# Patient Record
Sex: Male | Born: 1985 | Hispanic: Yes | Marital: Single
Health system: Southern US, Community
[De-identification: ages and names within clinical notes are randomized; demographics above are authoritative.]

---

## 2010-06-03 ENCOUNTER — Emergency Department (HOSPITAL_COMMUNITY): Admission: EM | Admit: 2010-06-03 | Discharge: 2010-06-03 | Payer: Self-pay | Admitting: Emergency Medicine

## 2018-04-14 ENCOUNTER — Encounter (HOSPITAL_COMMUNITY): Payer: Self-pay

## 2018-04-14 ENCOUNTER — Emergency Department (HOSPITAL_COMMUNITY): Payer: Self-pay

## 2018-04-14 ENCOUNTER — Inpatient Hospital Stay (HOSPITAL_COMMUNITY)
Admission: EM | Admit: 2018-04-14 | Discharge: 2018-04-18 | DRG: 982 | Disposition: A | Discharge status: Home / Self Care | Payer: Self-pay | Attending: General Surgery | Admitting: General Surgery

## 2018-04-14 ENCOUNTER — Inpatient Hospital Stay (HOSPITAL_COMMUNITY): Payer: Self-pay

## 2018-04-14 DIAGNOSIS — M25561 Pain in right knee: Secondary | ICD-10-CM | POA: Diagnosis present

## 2018-04-14 DIAGNOSIS — S51011A Laceration without foreign body of right elbow, initial encounter: Secondary | ICD-10-CM | POA: Diagnosis present

## 2018-04-14 DIAGNOSIS — F10120 Alcohol abuse with intoxication, uncomplicated: Secondary | ICD-10-CM | POA: Diagnosis present

## 2018-04-14 DIAGNOSIS — M25569 Pain in unspecified knee: Secondary | ICD-10-CM

## 2018-04-14 DIAGNOSIS — R740 Nonspecific elevation of levels of transaminase and lactic acid dehydrogenase [LDH]: Secondary | ICD-10-CM | POA: Diagnosis present

## 2018-04-14 DIAGNOSIS — R402362 Coma scale, best motor response, obeys commands, at arrival to emergency department: Secondary | ICD-10-CM | POA: Diagnosis present

## 2018-04-14 DIAGNOSIS — R402252 Coma scale, best verbal response, oriented, at arrival to emergency department: Secondary | ICD-10-CM | POA: Diagnosis present

## 2018-04-14 DIAGNOSIS — D62 Acute posthemorrhagic anemia: Secondary | ICD-10-CM | POA: Diagnosis present

## 2018-04-14 DIAGNOSIS — M25522 Pain in left elbow: Secondary | ICD-10-CM | POA: Diagnosis present

## 2018-04-14 DIAGNOSIS — M25571 Pain in right ankle and joints of right foot: Secondary | ICD-10-CM | POA: Diagnosis present

## 2018-04-14 DIAGNOSIS — Y92410 Unspecified street and highway as the place of occurrence of the external cause: Secondary | ICD-10-CM

## 2018-04-14 DIAGNOSIS — F10920 Alcohol use, unspecified with intoxication, uncomplicated: Secondary | ICD-10-CM

## 2018-04-14 DIAGNOSIS — M79672 Pain in left foot: Secondary | ICD-10-CM | POA: Diagnosis present

## 2018-04-14 DIAGNOSIS — Y908 Blood alcohol level of 240 mg/100 ml or more: Secondary | ICD-10-CM | POA: Diagnosis present

## 2018-04-14 DIAGNOSIS — R402132 Coma scale, eyes open, to sound, at arrival to emergency department: Secondary | ICD-10-CM | POA: Diagnosis present

## 2018-04-14 DIAGNOSIS — S36113A Laceration of liver, unspecified degree, initial encounter: Secondary | ICD-10-CM

## 2018-04-14 DIAGNOSIS — S36116A Major laceration of liver, initial encounter: Principal | ICD-10-CM | POA: Diagnosis present

## 2018-04-14 DIAGNOSIS — M79671 Pain in right foot: Secondary | ICD-10-CM | POA: Diagnosis present

## 2018-04-14 DIAGNOSIS — R7401 Elevation of levels of liver transaminase levels: Secondary | ICD-10-CM

## 2018-04-14 DIAGNOSIS — R0781 Pleurodynia: Secondary | ICD-10-CM | POA: Diagnosis present

## 2018-04-14 DIAGNOSIS — S36113S Laceration of liver, unspecified degree, sequela: Secondary | ICD-10-CM

## 2018-04-14 DIAGNOSIS — Z23 Encounter for immunization: Secondary | ICD-10-CM

## 2018-04-14 DIAGNOSIS — D72829 Elevated white blood cell count, unspecified: Secondary | ICD-10-CM | POA: Diagnosis present

## 2018-04-14 DIAGNOSIS — M25572 Pain in left ankle and joints of left foot: Secondary | ICD-10-CM | POA: Diagnosis present

## 2018-04-14 DIAGNOSIS — R000 Tachycardia, unspecified: Secondary | ICD-10-CM | POA: Diagnosis present

## 2018-04-14 DIAGNOSIS — M25562 Pain in left knee: Secondary | ICD-10-CM | POA: Diagnosis present

## 2018-04-14 HISTORY — PX: IR ANGIOGRAM VISCERAL SELECTIVE: IMG657

## 2018-04-14 HISTORY — PX: IR US GUIDE VASC ACCESS RIGHT: IMG2390

## 2018-04-14 HISTORY — PX: IR ANGIOGRAM SELECTIVE EACH ADDITIONAL VESSEL: IMG667

## 2018-04-14 LAB — CBC WITH DIFFERENTIAL/PLATELET
BASOS ABS: 0 10*3/uL (ref 0.0–0.1)
BASOS PCT: 0 %
EOS ABS: 0 10*3/uL (ref 0.0–0.7)
Eosinophils Relative: 0 %
HCT: 36.6 % — ABNORMAL LOW (ref 39.0–52.0)
Hemoglobin: 12.6 g/dL — ABNORMAL LOW (ref 13.0–17.0)
LYMPHS ABS: 1.4 10*3/uL (ref 0.7–4.0)
Lymphocytes Relative: 6 %
MCH: 31.6 pg (ref 26.0–34.0)
MCHC: 34.4 g/dL (ref 30.0–36.0)
MCV: 91.7 fL (ref 78.0–100.0)
MONO ABS: 1.9 10*3/uL — AB (ref 0.1–1.0)
Monocytes Relative: 8 %
NEUTROS ABS: 20.6 10*3/uL — AB (ref 1.7–7.7)
Neutrophils Relative %: 86 %
Platelets: 242 10*3/uL (ref 150–400)
RBC: 3.99 MIL/uL — ABNORMAL LOW (ref 4.22–5.81)
RDW: 13.1 % (ref 11.5–15.5)
WBC: 23.9 10*3/uL — ABNORMAL HIGH (ref 4.0–10.5)

## 2018-04-14 LAB — MRSA PCR SCREENING: MRSA by PCR: NEGATIVE

## 2018-04-14 LAB — RAPID URINE DRUG SCREEN, HOSP PERFORMED
Amphetamines: NOT DETECTED
Benzodiazepines: POSITIVE — AB
Cocaine: POSITIVE — AB
Opiates: NOT DETECTED
Tetrahydrocannabinol: NOT DETECTED

## 2018-04-14 LAB — COMPREHENSIVE METABOLIC PANEL
ALT: 234 U/L — AB (ref 17–63)
ANION GAP: 10 (ref 5–15)
AST: 280 U/L — ABNORMAL HIGH (ref 15–41)
Albumin: 4.2 g/dL (ref 3.5–5.0)
Alkaline Phosphatase: 68 U/L (ref 38–126)
BILIRUBIN TOTAL: 1.1 mg/dL (ref 0.3–1.2)
BUN: 11 mg/dL (ref 6–20)
CALCIUM: 8.7 mg/dL — AB (ref 8.9–10.3)
CO2: 25 mmol/L (ref 22–32)
CREATININE: 0.93 mg/dL (ref 0.61–1.24)
Chloride: 106 mmol/L (ref 101–111)
Glucose, Bld: 132 mg/dL — ABNORMAL HIGH (ref 65–99)
Potassium: 3.5 mmol/L (ref 3.5–5.1)
Sodium: 141 mmol/L (ref 135–145)
TOTAL PROTEIN: 6.8 g/dL (ref 6.5–8.1)

## 2018-04-14 LAB — HEMOGLOBIN AND HEMATOCRIT, BLOOD
HCT: 33.6 % — ABNORMAL LOW (ref 39.0–52.0)
HEMATOCRIT: 31.5 % — AB (ref 39.0–52.0)
HEMOGLOBIN: 10.7 g/dL — AB (ref 13.0–17.0)
Hemoglobin: 11.4 g/dL — ABNORMAL LOW (ref 13.0–17.0)

## 2018-04-14 LAB — URINALYSIS, ROUTINE W REFLEX MICROSCOPIC
Bacteria, UA: NONE SEEN
Bilirubin Urine: NEGATIVE
GLUCOSE, UA: NEGATIVE mg/dL
HGB URINE DIPSTICK: NEGATIVE
Ketones, ur: NEGATIVE mg/dL
LEUKOCYTES UA: NEGATIVE
Nitrite: NEGATIVE
PH: 6 (ref 5.0–8.0)
Protein, ur: 100 mg/dL — AB
Specific Gravity, Urine: >1.046 — ABNORMAL HIGH (ref 1.005–1.030)

## 2018-04-14 LAB — CBC
HCT: 47.2 % (ref 39.0–52.0)
HEMATOCRIT: 35.3 % — AB (ref 39.0–52.0)
HEMOGLOBIN: 16.3 g/dL (ref 13.0–17.0)
Hemoglobin: 12.2 g/dL — ABNORMAL LOW (ref 13.0–17.0)
MCH: 31.1 pg (ref 26.0–34.0)
MCH: 32 pg (ref 26.0–34.0)
MCHC: 34.5 g/dL (ref 30.0–36.0)
MCHC: 34.6 g/dL (ref 30.0–36.0)
MCV: 90.1 fL (ref 78.0–100.0)
MCV: 92.7 fL (ref 78.0–100.0)
PLATELETS: 272 10*3/uL (ref 150–400)
Platelets: 220 10*3/uL (ref 150–400)
RBC: 5.24 MIL/uL (ref 4.22–5.81)
RBC: 3.81 MIL/uL — ABNORMAL LOW (ref 4.22–5.81)
RDW: 12.8 % (ref 11.5–15.5)
RDW: 13.2 % (ref 11.5–15.5)
WBC: 6.7 10*3/uL (ref 4.0–10.5)
WBC: 16.3 10*3/uL — AB (ref 4.0–10.5)

## 2018-04-14 LAB — PROTIME-INR
INR: 0.94
Prothrombin Time: 12.4 seconds (ref 11.4–15.2)

## 2018-04-14 LAB — I-STAT CHEM 8, ED
BUN: 12 mg/dL (ref 6–20)
CALCIUM ION: 1.11 mmol/L — AB (ref 1.15–1.40)
CREATININE: 1.2 mg/dL (ref 0.61–1.24)
Chloride: 104 mmol/L (ref 101–111)
GLUCOSE: 126 mg/dL — AB (ref 65–99)
HCT: 47 % (ref 39.0–52.0)
Hemoglobin: 16 g/dL (ref 13.0–17.0)
POTASSIUM: 3.6 mmol/L (ref 3.5–5.1)
Sodium: 143 mmol/L (ref 135–145)
TCO2: 24 mmol/L (ref 22–32)

## 2018-04-14 LAB — SAMPLE TO BLOOD BANK

## 2018-04-14 LAB — ETHANOL: ALCOHOL ETHYL (B): 317 mg/dL — AB (ref <10)

## 2018-04-14 LAB — CDS SEROLOGY

## 2018-04-14 LAB — I-STAT CG4 LACTIC ACID, ED: Lactic Acid, Venous: 1.91 mmol/L — ABNORMAL HIGH (ref 0.5–1.9)

## 2018-04-14 MED ORDER — IOHEXOL 300 MG/ML  SOLN
100.0000 mL | Freq: Once | INTRAMUSCULAR | Status: AC | PRN
  Administered 2018-04-14: 100 mL via INTRAVENOUS

## 2018-04-14 MED ORDER — TETANUS-DIPHTH-ACELL PERTUSSIS 5-2.5-18.5 LF-MCG/0.5 IM SUSP
0.5000 mL | Freq: Once | INTRAMUSCULAR | Status: AC
  Administered 2018-04-14: 0.5 mL via INTRAMUSCULAR
  Filled 2018-04-14: qty 0.5

## 2018-04-14 MED ORDER — VITAMIN B-1 100 MG PO TABS
100.0000 mg | ORAL_TABLET | Freq: Every day | ORAL | Status: DC
  Administered 2018-04-15 – 2018-04-18 (×4): 100 mg via ORAL
  Filled 2018-04-14 (×4): qty 1

## 2018-04-14 MED ORDER — LIDOCAINE HCL (PF) 1 % IJ SOLN
INTRAMUSCULAR | Status: DC | PRN
  Administered 2018-04-14: 10 mL

## 2018-04-14 MED ORDER — TETANUS-DIPHTH-ACELL PERTUSSIS 5-2.5-18.5 LF-MCG/0.5 IM SUSP: 0.5000 mL | Freq: Once | INTRAMUSCULAR | Status: DC

## 2018-04-14 MED ORDER — SODIUM CHLORIDE 0.9 % IV BOLUS
1000.0000 mL | Freq: Once | INTRAVENOUS | Status: AC
  Administered 2018-04-14: 1000 mL via INTRAVENOUS

## 2018-04-14 MED ORDER — ADULT MULTIVITAMIN W/MINERALS CH
1.0000 | ORAL_TABLET | Freq: Every day | ORAL | Status: DC
  Administered 2018-04-15 – 2018-04-18 (×4): 1 via ORAL
  Filled 2018-04-14 (×4): qty 1

## 2018-04-14 MED ORDER — FENTANYL CITRATE (PF) 100 MCG/2ML IJ SOLN
INTRAMUSCULAR | Status: DC | PRN
  Administered 2018-04-14: 25 ug via INTRAVENOUS

## 2018-04-14 MED ORDER — LORAZEPAM 2 MG/ML IJ SOLN: 0.0000 mg | Freq: Two times a day (BID) | INTRAMUSCULAR | Status: DC

## 2018-04-14 MED ORDER — LACTATED RINGERS IV BOLUS
1000.0000 mL | Freq: Once | INTRAVENOUS | Status: AC
  Administered 2018-04-14: 1000 mL via INTRAVENOUS

## 2018-04-14 MED ORDER — LORAZEPAM 2 MG/ML IJ SOLN: 0.0000 mg | Freq: Two times a day (BID) | INTRAMUSCULAR | Status: AC

## 2018-04-14 MED ORDER — LORAZEPAM 1 MG PO TABS: 1.0000 mg | ORAL_TABLET | Freq: Four times a day (QID) | ORAL | Status: AC | PRN

## 2018-04-14 MED ORDER — LORAZEPAM 2 MG/ML IJ SOLN: 1.0000 mg | Freq: Four times a day (QID) | INTRAMUSCULAR | Status: AC | PRN

## 2018-04-14 MED ORDER — FOLIC ACID 1 MG PO TABS
1.0000 mg | ORAL_TABLET | Freq: Every day | ORAL | Status: DC
  Administered 2018-04-15 – 2018-04-18 (×4): 1 mg via ORAL
  Filled 2018-04-14 (×4): qty 1

## 2018-04-14 MED ORDER — DOCUSATE SODIUM 100 MG PO CAPS: 200.0000 mg | ORAL_CAPSULE | Freq: Two times a day (BID) | ORAL | Status: DC

## 2018-04-14 MED ORDER — SODIUM CHLORIDE 0.9 % IV SOLN
INTRAVENOUS | Status: DC | PRN
  Administered 2018-04-14: 100 mL/h via INTRAVENOUS

## 2018-04-14 MED ORDER — LIDOCAINE HCL (PF) 1 % IJ SOLN
10.0000 mL | Freq: Once | INTRAMUSCULAR | Status: AC
  Administered 2018-04-14: 10 mL
  Filled 2018-04-14: qty 10

## 2018-04-14 MED ORDER — ACETAMINOPHEN 500 MG PO TABS
1000.0000 mg | ORAL_TABLET | Freq: Four times a day (QID) | ORAL | Status: DC | PRN
Start: 2018-04-14 — End: 2018-04-18

## 2018-04-14 MED ORDER — LIDOCAINE HCL 1 % IJ SOLN
INTRAMUSCULAR | Status: AC
  Filled 2018-04-14: qty 20

## 2018-04-14 MED ORDER — MIDAZOLAM HCL 2 MG/2ML IJ SOLN
INTRAMUSCULAR | Status: AC
  Filled 2018-04-14: qty 4

## 2018-04-14 MED ORDER — MIDAZOLAM HCL 2 MG/2ML IJ SOLN
INTRAMUSCULAR | Status: DC | PRN
  Administered 2018-04-14: 0.5 mg via INTRAVENOUS

## 2018-04-14 MED ORDER — LORAZEPAM 2 MG/ML IJ SOLN: 0.0000 mg | Freq: Four times a day (QID) | INTRAMUSCULAR | Status: DC

## 2018-04-14 MED ORDER — ONDANSETRON HCL 4 MG/2ML IJ SOLN: 4.0000 mg | Freq: Four times a day (QID) | INTRAMUSCULAR | Status: DC | PRN

## 2018-04-14 MED ORDER — LORAZEPAM 2 MG/ML IJ SOLN: 0.0000 mg | Freq: Four times a day (QID) | INTRAMUSCULAR | Status: AC

## 2018-04-14 MED ORDER — ONDANSETRON 4 MG PO TBDP: 4.0000 mg | ORAL_TABLET | Freq: Four times a day (QID) | ORAL | Status: DC | PRN

## 2018-04-14 MED ORDER — IOPAMIDOL (ISOVUE-300) INJECTION 61%
INTRAVENOUS | Status: AC
  Administered 2018-04-14: 80 mL
  Filled 2018-04-14: qty 100

## 2018-04-14 MED ORDER — TRAMADOL HCL 50 MG PO TABS
50.0000 mg | ORAL_TABLET | Freq: Four times a day (QID) | ORAL | Status: DC | PRN
  Administered 2018-04-14 – 2018-04-16 (×5): 50 mg via ORAL
  Filled 2018-04-14 (×5): qty 1

## 2018-04-14 MED ORDER — LACTATED RINGERS IV SOLN
INTRAVENOUS | Status: DC
  Administered 2018-04-14 – 2018-04-15 (×3): via INTRAVENOUS

## 2018-04-14 MED ORDER — FENTANYL CITRATE (PF) 100 MCG/2ML IJ SOLN
INTRAMUSCULAR | Status: AC
  Filled 2018-04-14: qty 4

## 2018-04-14 MED ORDER — THIAMINE HCL 100 MG/ML IJ SOLN: 100.0000 mg | Freq: Every day | INTRAMUSCULAR | Status: DC

## 2018-04-14 MED ORDER — LACTATED RINGERS IV SOLN
INTRAVENOUS | Status: DC
  Administered 2018-04-14 – 2018-04-16 (×4): via INTRAVENOUS
  Administered 2018-04-17: 10 mL/h via INTRAVENOUS

## 2018-04-14 MED ORDER — FENTANYL CITRATE (PF) 100 MCG/2ML IJ SOLN
50.0000 ug | INTRAMUSCULAR | Status: DC | PRN
  Administered 2018-04-14 (×2): 50 ug via INTRAVENOUS
  Filled 2018-04-14 (×2): qty 2

## 2018-04-14 NOTE — Progress Notes (Signed)
Cleared C spine clinically, collar removed. Patient requesting something to drink, IR procedure note states no active extrav. Allow ice chips and sips of clears from floor. Continue to monitor  [name] , [facility] 04/14/2018, 1:59 PM Pager: [phone] Mon-Fri 7:00 am-4:30 pm Sat-Sun 7:00 am-11:30 am

## 2018-04-14 NOTE — Procedures (Signed)
Interventional Radiology Procedure Note  Procedure: Hepatic arteriography  Complications: None  Estimated Blood Loss: < 10 mL  Findings: Hepatic arteriography including selective right hepatic arteriography and sub-selective right hepatic branch artery angiography and cystic arteriography shows no evidence of active bleeding, arterial injury or pseudoaneurysm.  No embolization performed.  Jodi MarbleGlenn T. Fredia SorrowYamagata, M.D Pager:  [phone]
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
## 2018-04-14 NOTE — ED Notes (Signed)
Bladder scan performed; noted; IR to place foley cath
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
## 2018-04-15 ENCOUNTER — Inpatient Hospital Stay (HOSPITAL_COMMUNITY): Payer: Self-pay

## 2018-04-15 LAB — COMPREHENSIVE METABOLIC PANEL
ALBUMIN: 2.7 g/dL — AB (ref 3.5–5.0)
ALK PHOS: 39 U/L (ref 38–126)
ALT: 121 U/L — ABNORMAL HIGH (ref 17–63)
ANION GAP: 8 (ref 5–15)
AST: 84 U/L — ABNORMAL HIGH (ref 15–41)
BUN: 12 mg/dL (ref 6–20)
CALCIUM: 8.2 mg/dL — AB (ref 8.9–10.3)
CO2: 27 mmol/L (ref 22–32)
Chloride: 103 mmol/L (ref 101–111)
Creatinine, Ser: 0.91 mg/dL (ref 0.61–1.24)
GFR calc Af Amer: >60 mL/min (ref >60)
GFR calc non Af Amer: >60 mL/min (ref >60)
GLUCOSE: 102 mg/dL — AB (ref 65–99)
Potassium: 3.6 mmol/L (ref 3.5–5.1)
SODIUM: 138 mmol/L (ref 135–145)
Total Bilirubin: 1.5 mg/dL — ABNORMAL HIGH (ref 0.3–1.2)
Total Protein: 4.7 g/dL — ABNORMAL LOW (ref 6.5–8.1)

## 2018-04-15 LAB — HEMOGLOBIN AND HEMATOCRIT, BLOOD
HCT: 27.5 % — ABNORMAL LOW (ref 39.0–52.0)
HEMATOCRIT: 29.1 % — AB (ref 39.0–52.0)
HEMOGLOBIN: 9.6 g/dL — AB (ref 13.0–17.0)
Hemoglobin: 9.2 g/dL — ABNORMAL LOW (ref 13.0–17.0)

## 2018-04-15 LAB — CBC
HCT: 27.9 % — ABNORMAL LOW (ref 39.0–52.0)
HEMOGLOBIN: 9.4 g/dL — AB (ref 13.0–17.0)
MCH: 31.8 pg (ref 26.0–34.0)
MCHC: 33.7 g/dL (ref 30.0–36.0)
MCV: 94.3 fL (ref 78.0–100.0)
PLATELETS: 152 10*3/uL (ref 150–400)
RBC: 2.96 MIL/uL — AB (ref 4.22–5.81)
RDW: 13.6 % (ref 11.5–15.5)
WBC: 7.5 10*3/uL (ref 4.0–10.5)

## 2018-04-15 LAB — PROTIME-INR
INR: 1.05
Prothrombin Time: 13.6 seconds (ref 11.4–15.2)

## 2018-04-15 LAB — HIV ANTIBODY (ROUTINE TESTING W REFLEX): HIV SCREEN 4TH GENERATION: NONREACTIVE

## 2018-04-15 MED ORDER — DOCUSATE SODIUM 100 MG PO CAPS
100.0000 mg | ORAL_CAPSULE | Freq: Two times a day (BID) | ORAL | Status: DC
  Administered 2018-04-15 – 2018-04-18 (×7): 100 mg via ORAL
  Filled 2018-04-15 (×7): qty 1
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
## 2018-04-16 ENCOUNTER — Inpatient Hospital Stay (HOSPITAL_COMMUNITY): Payer: Self-pay

## 2018-04-16 LAB — COMPREHENSIVE METABOLIC PANEL
ALT: 95 U/L — ABNORMAL HIGH (ref 17–63)
ANION GAP: 6 (ref 5–15)
AST: 57 U/L — ABNORMAL HIGH (ref 15–41)
Albumin: 3 g/dL — ABNORMAL LOW (ref 3.5–5.0)
Alkaline Phosphatase: 45 U/L (ref 38–126)
BUN: 6 mg/dL (ref 6–20)
CO2: 28 mmol/L (ref 22–32)
Calcium: 8.1 mg/dL — ABNORMAL LOW (ref 8.9–10.3)
Chloride: 102 mmol/L (ref 101–111)
Creatinine, Ser: 0.77 mg/dL (ref 0.61–1.24)
GFR calc non Af Amer: >60 mL/min (ref >60)
Glucose, Bld: 106 mg/dL — ABNORMAL HIGH (ref 65–99)
Potassium: 3.5 mmol/L (ref 3.5–5.1)
SODIUM: 136 mmol/L (ref 135–145)
TOTAL PROTEIN: 5.3 g/dL — AB (ref 6.5–8.1)
Total Bilirubin: 1.5 mg/dL — ABNORMAL HIGH (ref 0.3–1.2)

## 2018-04-16 LAB — CBC
HCT: 28.2 % — ABNORMAL LOW (ref 39.0–52.0)
HEMOGLOBIN: 9.3 g/dL — AB (ref 13.0–17.0)
MCH: 31.4 pg (ref 26.0–34.0)
MCHC: 33 g/dL (ref 30.0–36.0)
MCV: 95.3 fL (ref 78.0–100.0)
Platelets: 145 10*3/uL — ABNORMAL LOW (ref 150–400)
RBC: 2.96 MIL/uL — AB (ref 4.22–5.81)
RDW: 13 % (ref 11.5–15.5)
WBC: 8.2 10*3/uL (ref 4.0–10.5)

## 2018-04-16 LAB — HEMOGLOBIN AND HEMATOCRIT, BLOOD
HCT: 27.5 % — ABNORMAL LOW (ref 39.0–52.0)
Hemoglobin: 9.3 g/dL — ABNORMAL LOW (ref 13.0–17.0)

## 2018-04-16 MED ORDER — OXYCODONE HCL 5 MG PO TABS
5.0000 mg | ORAL_TABLET | Freq: Four times a day (QID) | ORAL | Status: DC | PRN
  Administered 2018-04-16 – 2018-04-17 (×2): 5 mg via ORAL
  Filled 2018-04-16 (×2): qty 1

## 2018-04-16 MED ORDER — ENOXAPARIN SODIUM 40 MG/0.4ML ~~LOC~~ SOLN
40.0000 mg | SUBCUTANEOUS | Status: DC
  Administered 2018-04-16 – 2018-04-18 (×3): 40 mg via SUBCUTANEOUS
  Filled 2018-04-16 (×3): qty 0.4

## 2018-04-16 MED ORDER — TRAMADOL HCL 50 MG PO TABS
50.0000 mg | ORAL_TABLET | Freq: Four times a day (QID) | ORAL | Status: DC | PRN
  Administered 2018-04-16: 50 mg via ORAL
  Filled 2018-04-16: qty 1

## 2018-04-16 NOTE — Progress Notes (Signed)
Patient transferred to [facility] room 12 via w/c by [place].
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
## 2018-04-17 ENCOUNTER — Encounter (HOSPITAL_COMMUNITY): Payer: Self-pay | Admitting: Interventional Radiology

## 2018-04-17 LAB — COMPREHENSIVE METABOLIC PANEL
ALK PHOS: 43 U/L (ref 38–126)
ALT: 71 U/L — AB (ref 17–63)
ANION GAP: 10 (ref 5–15)
AST: 39 U/L (ref 15–41)
Albumin: 2.9 g/dL — ABNORMAL LOW (ref 3.5–5.0)
BILIRUBIN TOTAL: 1.4 mg/dL — AB (ref 0.3–1.2)
BUN: 6 mg/dL (ref 6–20)
CALCIUM: 8.5 mg/dL — AB (ref 8.9–10.3)
CO2: 25 mmol/L (ref 22–32)
CREATININE: 0.9 mg/dL (ref 0.61–1.24)
Chloride: 101 mmol/L (ref 101–111)
GFR calc non Af Amer: >60 mL/min (ref >60)
GLUCOSE: 150 mg/dL — AB (ref 65–99)
Potassium: 3.5 mmol/L (ref 3.5–5.1)
Sodium: 136 mmol/L (ref 135–145)
TOTAL PROTEIN: 5.5 g/dL — AB (ref 6.5–8.1)

## 2018-04-17 LAB — CBC
HEMATOCRIT: 25.8 % — AB (ref 39.0–52.0)
HEMOGLOBIN: 8.7 g/dL — AB (ref 13.0–17.0)
MCH: 31.4 pg (ref 26.0–34.0)
MCHC: 33.7 g/dL (ref 30.0–36.0)
MCV: 93.1 fL (ref 78.0–100.0)
Platelets: 160 10*3/uL (ref 150–400)
RBC: 2.77 MIL/uL — ABNORMAL LOW (ref 4.22–5.81)
RDW: 12.7 % (ref 11.5–15.5)
WBC: 7.7 10*3/uL (ref 4.0–10.5)
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
## 2018-04-17 NOTE — Plan of Care (Signed)
  Problem: Activity: Goal: Risk for activity intolerance will decrease Outcome: Progressing   Problem: Pain Managment: Goal: General experience of comfort will improve Outcome: Progressing   
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
## 2018-04-17 NOTE — Progress Notes (Signed)
Orthopedic Tech Progress Note Patient Details:  Justin PenmanRodolfo Vang 30-Dec-1985 [id number]  Ortho Devices Type of Ortho Device: Crutches Ortho Device/Splint Interventions: Casandra DoffingOrdered       Milissa Fesperman Craig 04/17/2018, 4:53 PM

## 2018-04-18 ENCOUNTER — Inpatient Hospital Stay (HOSPITAL_COMMUNITY): Payer: Self-pay

## 2018-04-18 LAB — CBC
HEMATOCRIT: 26.8 % — AB (ref 39.0–52.0)
HEMOGLOBIN: 9.1 g/dL — AB (ref 13.0–17.0)
MCH: 31.7 pg (ref 26.0–34.0)
MCHC: 34 g/dL (ref 30.0–36.0)
MCV: 93.4 fL (ref 78.0–100.0)
Platelets: 191 10*3/uL (ref 150–400)
RBC: 2.87 MIL/uL — ABNORMAL LOW (ref 4.22–5.81)
RDW: 12.7 % (ref 11.5–15.5)
WBC: 8.6 10*3/uL (ref 4.0–10.5)
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
## 2018-04-18 NOTE — Discharge Instructions (Signed)
Esguince de tobillo (Ankle Sprain) Un esguince de tobillo es una distensin o un desgarro en uno de los tejidos resistentes (ligamentos) del tobillo. CUIDADOS EN EL HOGAR  Mantenga el tobillo en reposo.  CenterPoint Energyome los medicamentos de venta libre y los recetados solamente como se lo haya indicado el mdico.  Durante 2 o 3 das, mantenga el tobillo por encima del nivel del corazn (elevado) tanto como sea posible.  Si se lo indican, aplique hielo sobre la zona: ? Ponga el hielo en una bolsa plstica. ? Coloque una FirstEnergy Corptoalla entre la piel y la bolsa de hielo. ? Coloque el hielo durante 20minutos, 2 a 3veces por da.  Si le pusieron un dispositivo ortopdico: ? selos como se lo hayan indicado. ? Slo debe quitarlo para ducharse o baarse. ? Trate de no mover mucho el tobillo, pero mueva los dedos de vez en cuando. Esto ayuda a evitar la hinchazn.  Si le pusieron una venda elstica (vendaje): ? Qutesela para ducharse o baarse. ? Trate de no mover mucho el tobillo, pero mueva los dedos de vez en cuando. Esto ayuda a evitar la hinchazn. ? Si siente que la venda est muy ajustada, puede aflojarla un poco para estar ms cmodo. ? Afloje la venda si pierde la sensibilidad en el pie, si tiene hormigueo en el pie o si el pie est fro y de Edison Internationalcolor azul.  Si tiene Murphy Oilmuletas, selas como se lo haya indicado el mdico. Siga usndolas hasta que pueda caminar sin sentir dolor en el tobillo. SOLICITE AYUDA SI:  Los hematomas o la hinchazn empeoran rpidamente.  El dolor no mejora despus de tomar medicamentos. SOLICITE AYUDA DE INMEDIATO SI:  No puede sentir el pie o los dedos del pie.  El pie o los dedos del pie estn Hide-A-Way Lakeazulados.  Siente un dolor muy intenso que [place]ora. Esta informacin no tiene Theme park managercomo fin reemplazar el consejo del mdico. Asegrese de hacerle al mdico cualquier pregunta que tenga. Document Released: 07/12/2012 Document Revised: 02/09/2016 Document Reviewed: 05/20/2015 Elsevier  Interactive Patient Education  2018 Elsevier Inc.    Laceracin heptica (Liver Laceration) Neomia DearUna laceracin heptica es un desgarro o un corte en el hgado. El hgado es el rgano slido ms grande del cuerpo y est involucrado en muchas funciones importantes. En algunos casos puede ser Winn-Dixieuna lesin muy grave. Puede causar mucho sangrado. Podra ser necesario recurrir a la Leisure centre managerciruga. Otras veces una laceracin heptica puede ser Research scientist (medical)un problema menor. Puede ser que todo lo que necesite es hacer reposo en cama. De cualquier [place] Lakes[place]era, casi siempre se requiere Doctor, hospitalun tratamiento en el hospital.  Las laceraciones hepticas se categorizan en grados de 1 a 5. Los nmeros ms bajos identifican las laceraciones que son menos graves que aquellas indicadas en los nmeros ms altos.   Gae BonGrado 1: se trata de un desgarro en el revestimiento exterior del hgado. Es de menos de  pulgada (1 cm) de profundidad.   Gae BonGrado 2: es Patent examinerun desgarro de alrededor de  a 1 pulgada (1 a 3 cm) de profundidad. Tiene menos de 4 pulgadas (10 cm) de largo.   Gae BonGrado 3: es un desgarro que tiene un poco ms de 1 pulgada (3 cm) de profundidad.   Grados 4 y 5: estos desgarros son muy profundos. Afectan gran parte del hgado.  CAUSAS  Un golpe en la zona alrededor del hgado (traumatismo). Un traumatismo puede desgarrar el hgado a pesar de que no se lastime la piel.  Una lesin en la que un objeto pasa a  travs de la piel hacia el hgado (lesin penetrante). SIGNOS Y SNTOMAS Los sntomas ms frecuentes de la laceracin heptica son:   Un abdomen hinchado y [place].   Dolor en el abdomen.   Dolor al presionar sobre el lado derecho del abdomen.  Otros sntomas son:   Heron Nay que proviene de una herida penetrante.   Escupir sangre.   Moretones en el abdomen.   Frecuencia cardaca acelerada.   Respiracin rpida.   Sensacin de debilidad y [place].  DIAGNSTICO Para determinar si tiene una laceracin heptica, el mdico  realizar un examen fsico y preguntar si ha tenido lesiones en la zona derecha del abdomen. Le indicarn varios estudios, como:  Anlisis de [place]. Podrn hacerle anlisis de sangre cada pocas horas. As se comprobar si an est perdiendo sangre.   Tomografa computada. Este estudio se hace para ver la laceracin o sangrado.  [place] Consiste en colocar una pequea cmara en el abdomen y observar directamente la superficie del hgado. TRATAMIENTO El tratamiento de la laceracin variar segn la profundidad del corte y la cantidad de sangrado. Las opciones de tratamiento son:  Reposo en cama. La persona es vigilada de Loss adjuster, chartered. Se efectan estudios con mucha frecuencia.   Transfusiones de [place]. Se aplica la sangre que dona otra persona. Esta sangre reemplaza a la perdida por la lesin. Quizs necesite recibir repetir la transfusin varias veces.   Ciruga. Puede ser necesario un procedimiento para abrir el abdomen. Durante el procedimiento, colocarn un material especial alrededor de Engineer, production para que ayude en la curacin, o la repararn. INSTRUCCIONES PARA EL CUIDADO EN EL HOGAR  Tome slo medicamentos de venta libre o recetados, segn las indicaciones del mdico. Tome los medicamentos recetados tal como se le indic. No tome otros medicamentos sin consultar a su mdico.  Haga reposo y limite las actividades segn las indicaciones del mdico. Puede ser que pasen varios meses antes de que pueda volver a su [place] rutina. No participe en actividades que impliquen contacto fsico o requieran energa extra hasta que el mdico lo autorice.  Cumpla con todas las visitas de control, segn le indique su mdico. Posiblemente necesite ms anlisis de sangre o realizar otra tomografa computada para verificar que la herida se est curando. SOLICITE ATENCIN MDICA SI:  El dolor abdominal persiste.   Se siente ms dbil y cansado que de costumbre.  SOLICITE ATENCIN MDICA DE  INMEDIATO SI:  El dolor abdominal empeora.   Tiene un corte o incisin en la piel que est irritado, se hincha o supura lquido.   Se siente mareado o muy dbil.   Tiene dificultad para respirar.   Tiene fiebre.  ASEGRESE DE QUE:  Comprende estas instrucciones.  Controlar su afeccin.  Recibir ayuda de inmediato si no mejora o si empeora. Esta informacin no tiene Theme park manager el consejo del mdico. Asegrese de hacerle al mdico cualquier pregunta que tenga. Document Released: 07/12/2012 Document Revised: 06/20/2013 Elsevier Interactive Patient Education  2017 [employer].

## 2018-04-18 NOTE — Progress Notes (Signed)
Discharge instructions given via stratus interpretor. Patient verbalized understanding. CAM boot and crutches bedside, CAM boot applied to left lower extremity. Patient left unit via wheelchair with nursing staff and all of belongings in stable condition.

## 2018-04-18 NOTE — Progress Notes (Signed)
Orthopedic Tech Progress Note Patient Details:  Justin PenmanRodolfo Vang 08-29-1986 [id number]  Ortho Devices Type of Ortho Device: CAM walker Ortho Device/Splint Location: lle Ortho Device/Splint Interventions: Application   Post Interventions Patient Tolerated: Well Instructions Provided: Care of device   Nikki DomCrawford, Dalylah Ramey 04/18/2018, 1:52 PM

## 2018-04-18 NOTE — Progress Notes (Signed)
Orthopedic Tech Progress Note Patient Details:  Orland PenmanRodolfo Brownlow [birth year] [id number]  Ortho Devices Type of Ortho Device: CAM walker Ortho Device/Splint Location: lle Ortho Device/Splint Interventions: Application   Post Interventions Patient Tolerated: Well Instructions Provided: Care of device   Nikki DomCrawford, Damya Comley 04/18/2018, 1:54 PM
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
## 2018-10-11 IMAGING — DX DG ANKLE PORT 2V*R*
3 series · 3 of 3 positions shown · non-contrast
Comparison: None.

CLINICAL DATA: Pain

EXAM:
PORTABLE RIGHT [id number] VIEW

[ankle ap]
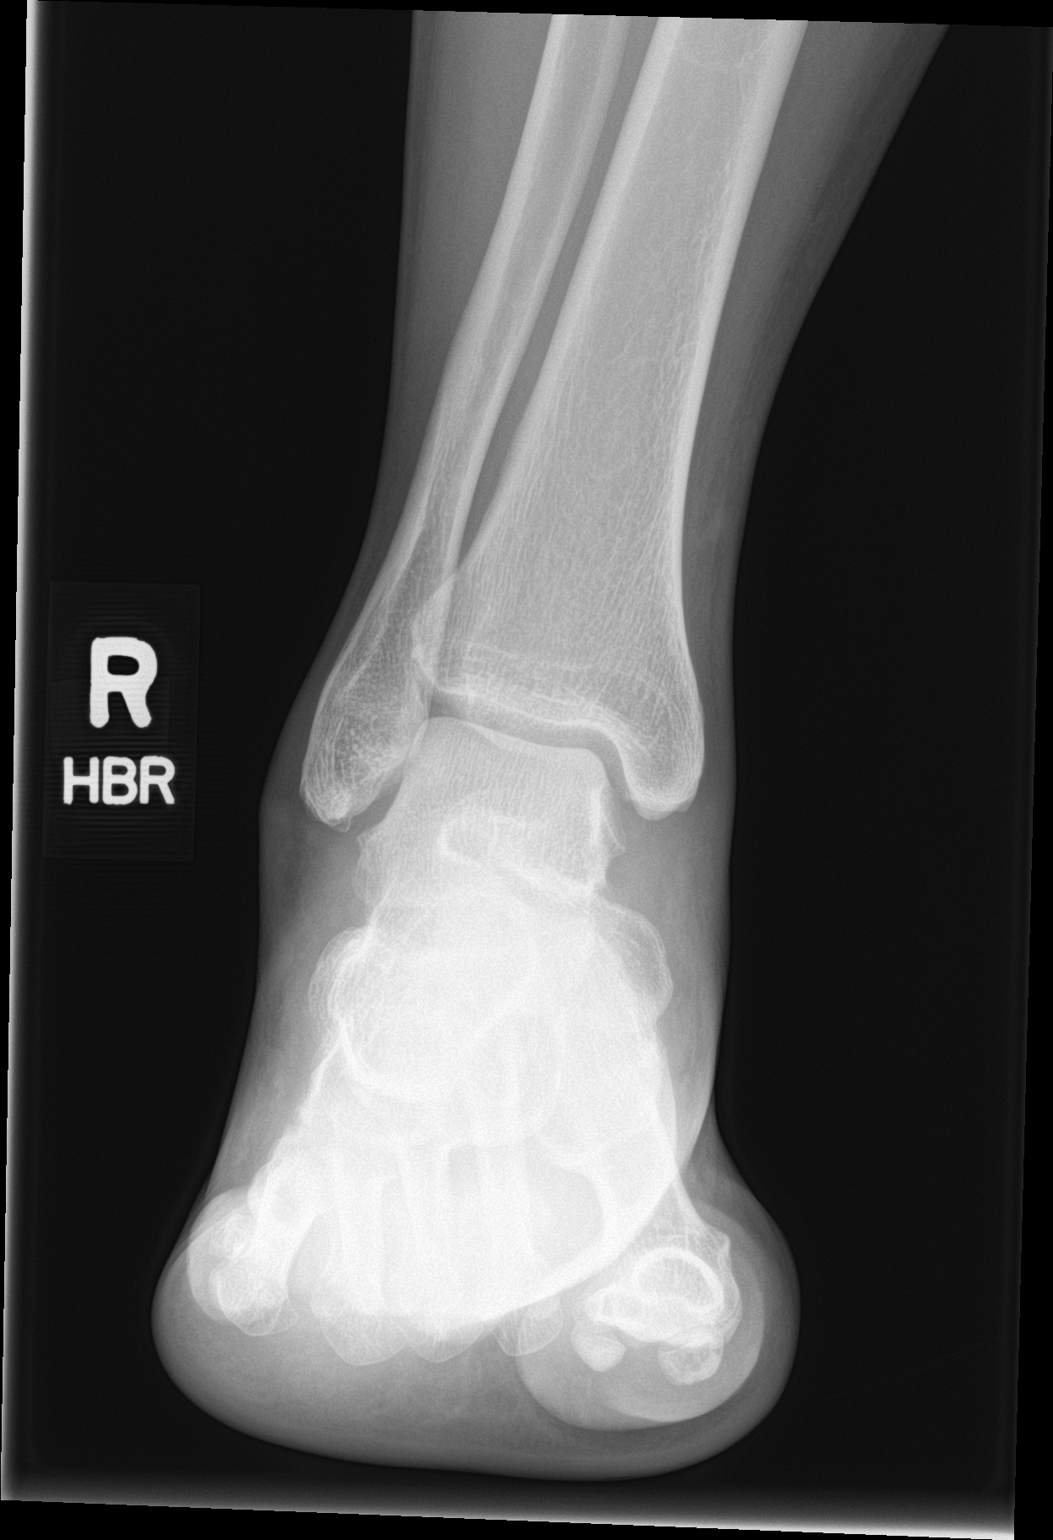

[ankle lat]
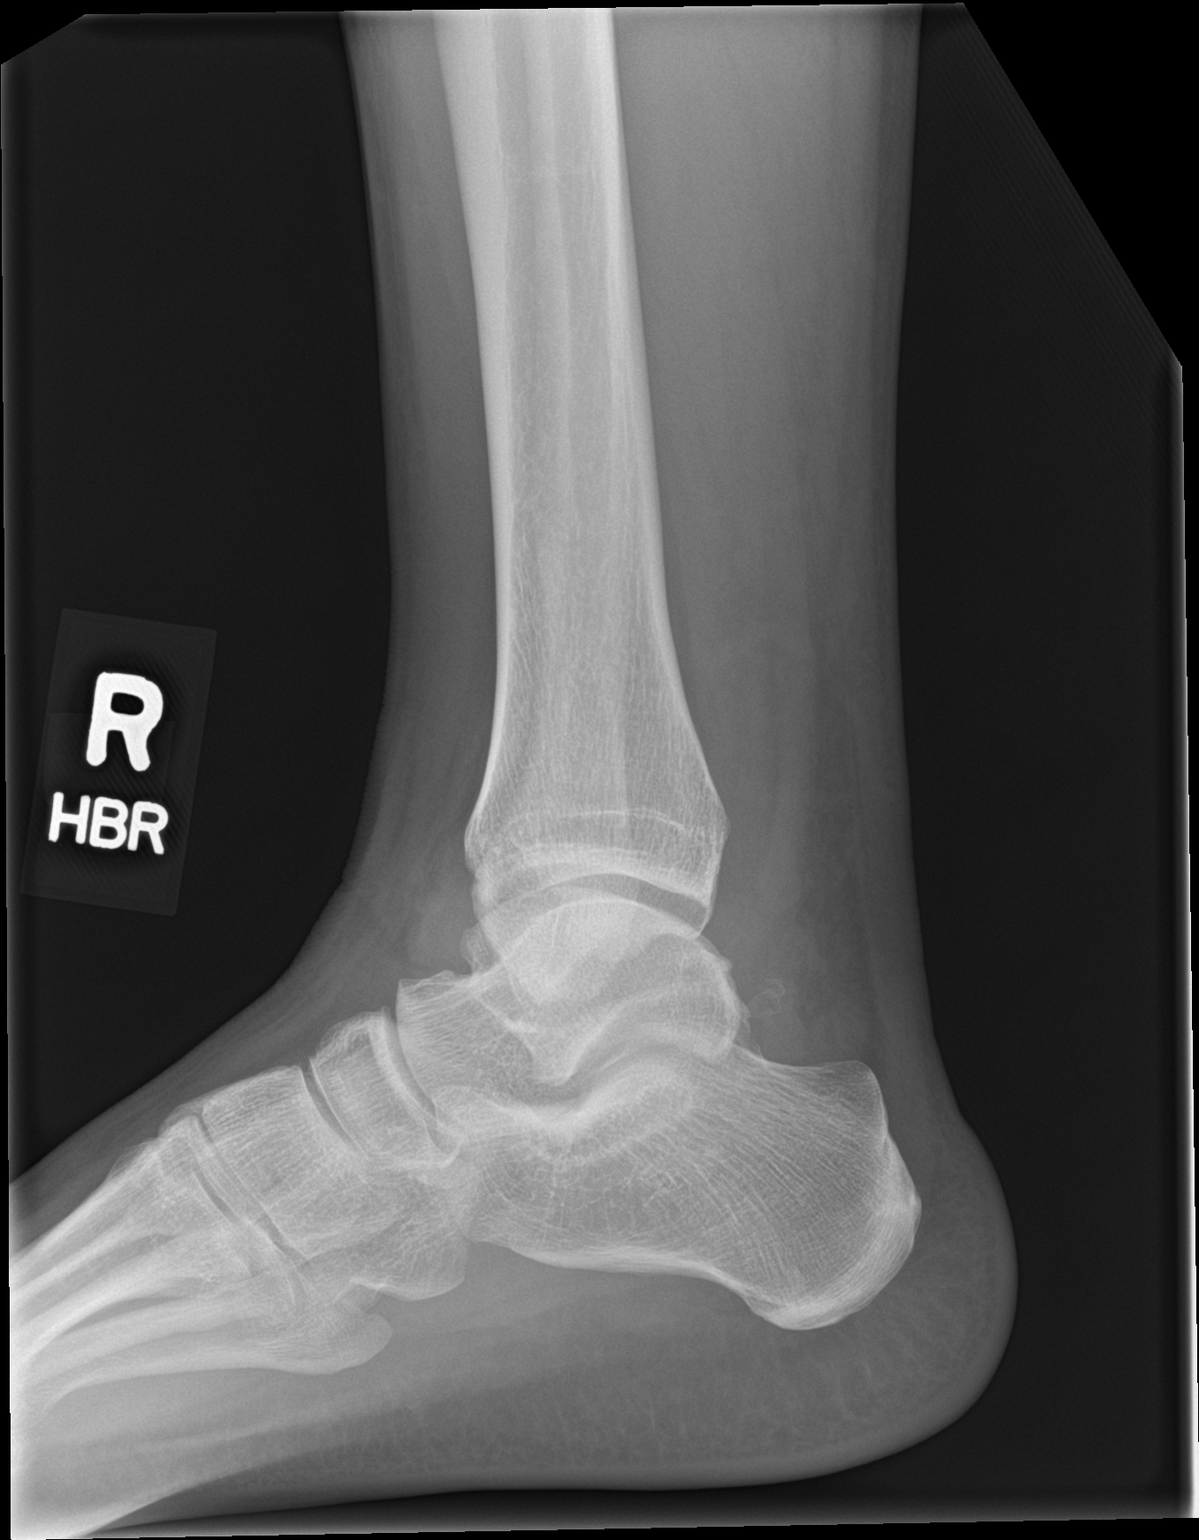

[ankle obl]
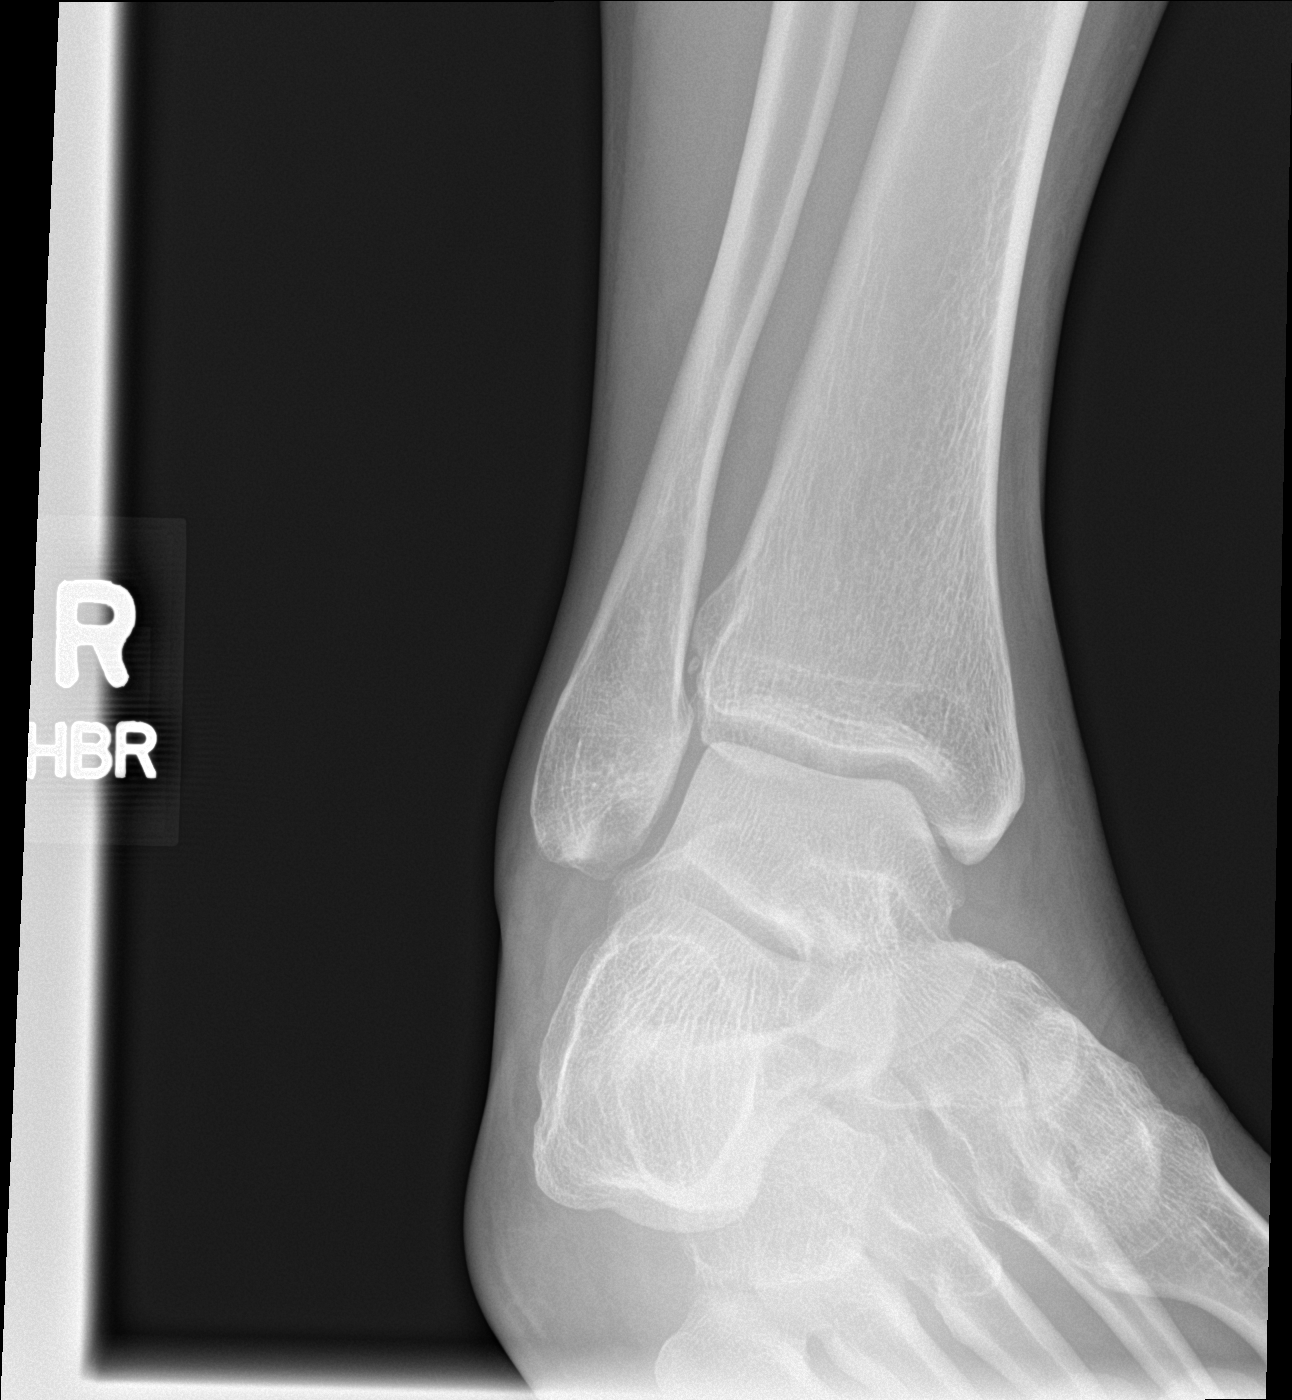

[3 of 3 positions shown; findings below may reference images not displayed]

FINDINGS: Frontal, oblique, and lateral views were obtained. There is mild
soft tissue swelling. No acute fracture. There is a joint effusion.
There is no appreciable joint space narrowing or erosion. A tiny
calcification between the distal tibia and fibula is well corticated
and may represent residua of old trauma. Ankle mortise appears
intact.
IMPRESSION: Soft tissue swelling with joint effusion. Question ligamentous
injury. No acute fracture. Small well corticated calcification
between the distal tibia and distal fibula may represent residua of
old trauma. No appreciable arthropathic change.
